# Patient Record
Sex: Male | Born: 1995 | Race: Black or African American | Hispanic: No | Marital: Single | Smoking: Never smoker
Health system: Southern US, Community
[De-identification: ages and names within clinical notes are randomized; demographics above are authoritative.]

---

## 2016-03-23 ENCOUNTER — Emergency Department (HOSPITAL_COMMUNITY)
Admission: EM | Admit: 2016-03-23 | Discharge: 2016-03-23 | Disposition: A | Discharge status: Home / Self Care | Payer: Worker's Compensation | Attending: Emergency Medicine | Admitting: Emergency Medicine

## 2016-03-23 ENCOUNTER — Encounter (HOSPITAL_COMMUNITY): Payer: Self-pay | Admitting: Emergency Medicine

## 2016-03-23 DIAGNOSIS — Y999 Unspecified external cause status: Secondary | ICD-10-CM | POA: Insufficient documentation

## 2016-03-23 DIAGNOSIS — Y939 Activity, unspecified: Secondary | ICD-10-CM | POA: Diagnosis not present

## 2016-03-23 DIAGNOSIS — W228XXA Striking against or struck by other objects, initial encounter: Secondary | ICD-10-CM | POA: Diagnosis not present

## 2016-03-23 DIAGNOSIS — S0990XA Unspecified injury of head, initial encounter: Secondary | ICD-10-CM | POA: Insufficient documentation

## 2016-03-23 DIAGNOSIS — Y929 Unspecified place or not applicable: Secondary | ICD-10-CM | POA: Diagnosis not present

## 2016-03-23 MED ORDER — IBUPROFEN 800 MG PO TABS: 800.0000 mg | ORAL_TABLET | Freq: Three times a day (TID) | ORAL | 0 refills | Status: DC

## 2016-03-23 NOTE — ED Triage Notes (Signed)
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 Having continued head pain. Slight edema noted to L forehead. No LOC. Pt alert and oriented

## 2016-03-23 NOTE — ED Provider Notes (Signed)
[facility]-EMERGENCY DEPT Provider Note   CSN: [id number] Arrival date & time: 03/23/16  0844     History   Chief Complaint Chief Complaint  Patient presents with  . Head Injury    HPI James Trevino is a 20 y.o. male.  HPI   James Trevino is a 20 y.o. male, patient with no pertinent past medical history, presenting to the ED for assessment following a head injury that occurred 4 days ago. 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 Patient complains of pain to the left scalp. Denies LOC, nausea/vomiting, dizziness, wounds, additional injury, or any other complaints.      History reviewed. No pertinent past medical history.  There are no active problems to display for this patient.   History reviewed. No pertinent surgical history.     Home Medications    Prior to Admission medications   Medication Sig Start Date End Date Taking? Authorizing Provider  ibuprofen (ADVIL,MOTRIN) 800 MG tablet Take 1 tablet (800 mg total) by mouth 3 (three) times daily. 03/23/16   Anselm PancoastShawn C Lee Kalt, PA-C    Family History History reviewed. No pertinent family history.  Social History Social History  Substance Use Topics  . Smoking status: Never Smoker  . Smokeless tobacco: Never Used  . Alcohol use No     Allergies   Patient has no known allergies.   Review of Systems Review of Systems  HENT:       Scalp tenderness  Respiratory: Negative for shortness of breath.   Cardiovascular: Negative for chest pain.  Gastrointestinal: Negative for nausea and vomiting.  Musculoskeletal: Negative for back pain and neck pain.  Skin: Negative for wound.  Neurological: Negative for dizziness, light-headedness, numbness and headaches.  All other systems reviewed and are negative.    Physical Exam Updated Vital Signs BP 117/61   Pulse 65   Temp 97.7 F (36.5 C) (Oral)   Resp 16    SpO2 100%   Physical Exam  Constitutional: He appears well-developed and well-nourished. No distress.  HENT:  Head: Normocephalic.  Tenderness to the left parietal scalp without noted swelling, crepitus, or instability. No wounds or hemorrhage noted.  Eyes: Conjunctivae and EOM are normal. Pupils are equal, round, and reactive to light.  Neck: Normal range of motion. Neck supple.  Cardiovascular: Normal rate, regular rhythm and intact distal pulses.   Pulmonary/Chest: Effort normal. No respiratory distress.  Abdominal: Soft. There is no tenderness. There is no guarding.  Musculoskeletal: He exhibits no edema.  Normal motor function intact in all extremities and spine. No midline spinal tenderness.   Lymphadenopathy:    He has no cervical adenopathy.  Neurological: He is alert.  No sensory deficits. Strength 5/5 in all extremities. No gait disturbance. Coordination intact. Cranial nerves III-XII grossly intact.   Skin: Skin is warm and dry. He is not diaphoretic.  Psychiatric: He has a normal mood and affect. His behavior is normal.  Nursing note and vitals reviewed.    ED Treatments / Results  Labs (all labs ordered are listed, but only abnormal results are displayed) Labs Reviewed - No data to display  EKG  EKG Interpretation None       Radiology No results found.  Procedures Procedures (including critical care time)  Medications Ordered in ED Medications - No data to display   Initial Impression / Assessment and Plan / ED Course  I have reviewed the triage vital signs and the nursing notes.  Pertinent labs & imaging results that were available during my care of the patient were reviewed by me and considered in my medical decision making (see chart for details).  Clinical Course     Patient presents for evaluation following a head injury 4 days ago. Patient has no neuro or functional deficits. No signs of serious head injury or concussion. No indication for  imaging at this time. The patient was given instructions for home care as well as return precautions. Patient voices understanding of these instructions, accepts the plan, and is comfortable with discharge.     Final Clinical Impressions(s) / ED Diagnoses   Final diagnoses:  Injury of head, initial encounter    New Prescriptions New Prescriptions   IBUPROFEN (ADVIL,MOTRIN) 800 MG TABLET    Take 1 tablet (800 mg total) by mouth 3 (three) times daily.     Anselm PancoastShawn C Navon Kotowski, PA-C 03/23/16 [id number]    Nira ConnPedro Eduardo Cardama, MD 03/24/16 [id number]

## 2016-03-23 NOTE — Discharge Instructions (Signed)
There were no signs of serious head injury or concussion on your exam today. Take 500 mg of naproxen every 12 hours or 800 mg of ibuprofen every 8 hours for the next 3 days. Take these medications with food to avoid upset stomach. Follow up with a primary care provider for any future management of these complaints.

## 2017-10-19 ENCOUNTER — Emergency Department (HOSPITAL_COMMUNITY): Payer: Worker's Compensation

## 2017-10-19 ENCOUNTER — Emergency Department (HOSPITAL_COMMUNITY)
Admission: EM | Admit: 2017-10-19 | Discharge: 2017-10-20 | Disposition: A | Discharge status: Home / Self Care | Payer: Worker's Compensation | Attending: Emergency Medicine | Admitting: Emergency Medicine

## 2017-10-19 ENCOUNTER — Encounter (HOSPITAL_COMMUNITY): Payer: Self-pay

## 2017-10-19 DIAGNOSIS — Y990 Civilian activity done for income or pay: Secondary | ICD-10-CM | POA: Insufficient documentation

## 2017-10-19 DIAGNOSIS — Y9289 Other specified places as the place of occurrence of the external cause: Secondary | ICD-10-CM | POA: Diagnosis not present

## 2017-10-19 DIAGNOSIS — W3189XA Contact with other specified machinery, initial encounter: Secondary | ICD-10-CM | POA: Diagnosis not present

## 2017-10-19 DIAGNOSIS — S99921A Unspecified injury of right foot, initial encounter: Secondary | ICD-10-CM | POA: Diagnosis present

## 2017-10-19 DIAGNOSIS — S9031XA Contusion of right foot, initial encounter: Secondary | ICD-10-CM | POA: Diagnosis not present

## 2017-10-19 DIAGNOSIS — Y9389 Activity, other specified: Secondary | ICD-10-CM | POA: Diagnosis not present

## 2017-10-19 MED ORDER — NAPROXEN 500 MG PO TABS: 500.0000 mg | ORAL_TABLET | Freq: Two times a day (BID) | ORAL | 0 refills | Status: DC

## 2017-10-19 MED ORDER — KETOROLAC TROMETHAMINE 30 MG/ML IJ SOLN
30.0000 mg | Freq: Once | INTRAMUSCULAR | Status: AC
  Administered 2017-10-20: 30 mg via INTRAMUSCULAR
  Filled 2017-10-19: qty 1

## 2017-10-19 NOTE — ED Triage Notes (Signed)
Pt presents with pain to R toes, pt was at work, standing on a pallet when forklift rammed into pallet, striking pt's R foot, separating his shoe; pt able to bear weight but with difficulty.

## 2017-10-19 NOTE — ED Provider Notes (Signed)
[facility] Provider Note   CSN: [id number] Arrival date & time: 10/19/17  2100     History   Chief Complaint Chief Complaint  Patient presents with  . Foot Pain    HPI James Trevino is a 22 y.o. male who presents to ED for evaluation of right foot pain.  He was at work when a forklift accidentally pierced through his shoe.  It hit his toes.  He denies any wounds.  He states that he has had pain to all of his toes and the dorsum of his foot as a result of it.  Pain is worse with bearing weight.  Denies any prior fracture, dislocations or procedures in the area.  HPI  History reviewed. No pertinent past medical history.  There are no active problems to display for this patient.   History reviewed. No pertinent surgical history.      Home Medications    Prior to Admission medications   Medication Sig Start Date End Date Taking? Authorizing Provider  ibuprofen (ADVIL,MOTRIN) 800 MG tablet Take 1 tablet (800 mg total) by mouth 3 (three) times daily. 03/23/16   Joy, Shawn C, PA-C  naproxen (NAPROSYN) 500 MG tablet Take 1 tablet (500 mg total) by mouth 2 (two) times daily. 10/19/17   Dietrich PatesKhatri, Elaria Osias, PA-C    Family History History reviewed. No pertinent family history.  Social History Social History   Tobacco Use  . Smoking status: Never Smoker  . Smokeless tobacco: Never Used  Substance Use Topics  . Alcohol use: No  . Drug use: Not on file     Allergies   Patient has no known allergies.   Review of Systems Review of Systems  Constitutional: Negative for chills and fever.  Musculoskeletal: Positive for arthralgias and myalgias. Negative for gait problem, neck pain and neck stiffness.  Skin: Negative for wound.  Neurological: Negative for weakness and numbness.     Physical Exam Updated Vital Signs BP 115/79 (BP Location: Right Arm)   Pulse 63   Temp 98.1 F (36.7 C) (Oral)   Resp 18   Ht 6\' 4"  (1.93 m)   Wt  97.5 kg (215 lb)   SpO2 100%   BMI 26.17 kg/m   Physical Exam  Constitutional: He appears well-developed and well-nourished. No distress.  HENT:  Head: Normocephalic and atraumatic.  Eyes: Conjunctivae and EOM are normal. No scleral icterus.  Neck: Normal range of motion.  Pulmonary/Chest: Effort normal. No respiratory distress.  Musculoskeletal:  Tenderness to palpation of the IP joint of the right great toe. TTP of dorsum of right foot. No visible deformity, erythema or edema noted. 2+ DP pulse noted.  Reports normal sensation with light touch.  Full active and passive range of motion of ankle and digits without difficulty.  Neurological: He is alert.  Skin: No rash noted. He is not diaphoretic.  Psychiatric: He has a normal mood and affect.  Nursing note and vitals reviewed.    ED Treatments / Results  Labs (all labs ordered are listed, but only abnormal results are displayed) Labs Reviewed - No data to display  EKG None  Radiology Dg Foot Complete Right  Result Date: 10/19/2017 CLINICAL DATA:  Injured by a forklift at work EXAM: RIGHT FOOT COMPLETE - 3+ VIEW COMPARISON:  None FINDINGS: Osseous mineralization normal. Joint spaces preserved. No acute fracture, dislocation, or bone destruction. IMPRESSION: No acute osseous abnormalities. Electronically Signed   By: Ulyses SouthwardMark  Boles M.D.   On:  10/19/2017 21:52    Procedures Procedures (including critical care time)  Medications Ordered in ED Medications  ketorolac (TORADOL) 30 MG/ML injection 30 mg (has no administration in time range)     Initial Impression / Assessment and Plan / ED Course  I have reviewed the triage vital signs and the nursing notes.  Pertinent labs & imaging results that were available during my care of the patient were reviewed by me and considered in my medical decision making (see chart for details).     Patient presents to ED for evaluation of right foot pain.  He was at work when a forklift  accidentally ran through his shoe and hit his toes.  On physical exam there is no open wounds noted.  Area is neurovascularly intact.  There is tenderness to palpation of the dorsum of the foot and digits with no changes to range of motion.  No erythema, edema or warmth noted.  X-ray returned as negative.  Suspect that symptoms are due to a contusion.  Will advise Ace wrap, rice therapy and anti-inflammatories.  Advised to return to ED for any severe worsening symptoms.  Portions of this note were generated with Scientist, clinical (histocompatibility and immunogenetics). Dictation errors may occur despite best attempts at proofreading.   Final Clinical Impressions(s) / ED Diagnoses   Final diagnoses:  Contusion of right foot, initial encounter    ED Discharge Orders        Ordered    naproxen (NAPROSYN) 500 MG tablet  2 times daily     10/19/17 2348       Dietrich Pates, PA-C 10/19/17 2351    Palumbo, April, MD 10/19/17 2351

## 2017-11-05 NOTE — ED Notes (Addendum)
6/28 the pt came to front desk asking for work note for at least a week off. Pt informed there is no saved work note on file but that I could print one for the day he was here. Pt refused that and said it would not help. Pt frustrated that I could not give him a note for 1 week plus off. Pt given business card for pt experience.

## 2018-03-08 ENCOUNTER — Other Ambulatory Visit: Payer: Self-pay | Admitting: Family Medicine

## 2018-03-08 ENCOUNTER — Ambulatory Visit: Payer: Self-pay

## 2018-03-08 DIAGNOSIS — M79674 Pain in right toe(s): Secondary | ICD-10-CM

## 2018-04-01 ENCOUNTER — Encounter (HOSPITAL_COMMUNITY): Payer: Self-pay | Admitting: *Deleted

## 2018-04-01 ENCOUNTER — Ambulatory Visit (HOSPITAL_COMMUNITY)
Admission: EM | Admit: 2018-04-01 | Discharge: 2018-04-01 | Disposition: A | Discharge status: Home / Self Care | Payer: PRIVATE HEALTH INSURANCE | Source: Home / Self Care

## 2018-04-01 ENCOUNTER — Other Ambulatory Visit: Payer: Self-pay

## 2018-04-01 ENCOUNTER — Emergency Department (HOSPITAL_COMMUNITY)
Admission: EM | Admit: 2018-04-01 | Discharge: 2018-04-02 | Disposition: A | Discharge status: Home / Self Care | Payer: PRIVATE HEALTH INSURANCE | Attending: Emergency Medicine | Admitting: Emergency Medicine

## 2018-04-01 ENCOUNTER — Encounter (HOSPITAL_COMMUNITY): Payer: Self-pay | Admitting: Emergency Medicine

## 2018-04-01 DIAGNOSIS — Z79899 Other long term (current) drug therapy: Secondary | ICD-10-CM | POA: Insufficient documentation

## 2018-04-01 DIAGNOSIS — R1013 Epigastric pain: Secondary | ICD-10-CM | POA: Insufficient documentation

## 2018-04-01 LAB — URINALYSIS, ROUTINE W REFLEX MICROSCOPIC
BILIRUBIN URINE: NEGATIVE
Glucose, UA: NEGATIVE mg/dL
HGB URINE DIPSTICK: NEGATIVE
Ketones, ur: NEGATIVE mg/dL
Leukocytes, UA: NEGATIVE
NITRITE: NEGATIVE
PROTEIN: NEGATIVE mg/dL
Specific Gravity, Urine: 1.025 (ref 1.005–1.030)
pH: 8 (ref 5.0–8.0)

## 2018-04-01 LAB — CBC
HCT: 48.3 % (ref 39.0–52.0)
Hemoglobin: 15.2 g/dL (ref 13.0–17.0)
MCH: 27 pg (ref 26.0–34.0)
MCHC: 31.5 g/dL (ref 30.0–36.0)
MCV: 85.6 fL (ref 80.0–100.0)
NRBC: 0 % (ref 0.0–0.2)
PLATELETS: 166 10*3/uL (ref 150–400)
RBC: 5.64 MIL/uL (ref 4.22–5.81)
RDW: 12.6 % (ref 11.5–15.5)
WBC: 5.2 10*3/uL (ref 4.0–10.5)

## 2018-04-01 LAB — COMPREHENSIVE METABOLIC PANEL
ALT: 37 U/L (ref 0–44)
ANION GAP: 6 (ref 5–15)
AST: 35 U/L (ref 15–41)
Albumin: 4.2 g/dL (ref 3.5–5.0)
Alkaline Phosphatase: 57 U/L (ref 38–126)
BUN: 14 mg/dL (ref 6–20)
CHLORIDE: 105 mmol/L (ref 98–111)
CO2: 27 mmol/L (ref 22–32)
Calcium: 9.6 mg/dL (ref 8.9–10.3)
Creatinine, Ser: 1.16 mg/dL (ref 0.61–1.24)
GFR calc Af Amer: >60 mL/min (ref >60)
Glucose, Bld: 101 mg/dL — ABNORMAL HIGH (ref 70–99)
POTASSIUM: 3.8 mmol/L (ref 3.5–5.1)
Sodium: 138 mmol/L (ref 135–145)
Total Bilirubin: 0.7 mg/dL (ref 0.3–1.2)
Total Protein: 7.6 g/dL (ref 6.5–8.1)

## 2018-04-01 LAB — LIPASE, BLOOD: LIPASE: 36 U/L (ref 11–51)

## 2018-04-01 MED ORDER — LIDOCAINE VISCOUS HCL 2 % MT SOLN
15.0000 mL | Freq: Once | OROMUCOSAL | Status: AC
  Administered 2018-04-02: 15 mL via ORAL
  Filled 2018-04-01: qty 15

## 2018-04-01 MED ORDER — ALUM & MAG HYDROXIDE-SIMETH 200-200-20 MG/5ML PO SUSP
30.0000 mL | Freq: Once | ORAL | Status: AC
  Administered 2018-04-02: 30 mL via ORAL
  Filled 2018-04-01: qty 30

## 2018-04-01 MED ORDER — OMEPRAZOLE 20 MG PO CPDR: 20.0000 mg | DELAYED_RELEASE_CAPSULE | Freq: Every day | ORAL | 0 refills | Status: DC

## 2018-04-01 MED ORDER — ONDANSETRON 4 MG PO TBDP: 4.0000 mg | ORAL_TABLET | Freq: Three times a day (TID) | ORAL | 0 refills | Status: AC | PRN

## 2018-04-01 NOTE — ED Provider Notes (Signed)
TIME SEEN: 11:35 PM  CHIEF COMPLAINT: Epigastric abdominal pain  HPI: Patient is a 22 year old male with no significant past medical history who presents to the emergency department with 3 days of sharp epigastric abdominal pain that is worse with lying flat.  Tried Pepto-Bismol and magnesium citrate without relief.  Seen in urgent care today and prescribed omeprazole and Zofran.  States he was told that if symptoms worsen to come to the ED.  Denies fevers, vomiting but has had nausea.  No bloody stool or melena.  No previous abdominal surgery.  No sick contacts or recent travel.  ROS: See HPI Constitutional: no fever  Eyes: no drainage  ENT: no runny nose   Cardiovascular:  no chest pain  Resp: no SOB  GI: no vomiting GU: no dysuria Integumentary: no rash  Allergy: no hives  Musculoskeletal: no leg swelling  Neurological: no slurred speech ROS otherwise negative  PAST MEDICAL HISTORY/PAST SURGICAL HISTORY:  History reviewed. No pertinent past medical history.  MEDICATIONS:  Prior to Admission medications   Medication Sig Start Date End Date Taking? Authorizing Provider  ibuprofen (ADVIL,MOTRIN) 800 MG tablet Take 1 tablet (800 mg total) by mouth 3 (three) times daily. 03/23/16   Joy, Shawn C, PA-C  omeprazole (PRILOSEC) 20 MG capsule Take 1 capsule (20 mg total) by mouth daily. 04/01/18   Cathie Hoops, Amy V, PA-C  ondansetron (ZOFRAN ODT) 4 MG disintegrating tablet Take 1 tablet (4 mg total) by mouth every 8 (eight) hours as needed for nausea or vomiting. 04/01/18   Belinda Fisher, PA-C    ALLERGIES:  No Known Allergies  SOCIAL HISTORY:  Social History   Tobacco Use  . Smoking status: Never Smoker  . Smokeless tobacco: Never Used  Substance Use Topics  . Alcohol use: No    FAMILY HISTORY: Family History  Problem Relation Age of Onset  . Healthy Mother   . Healthy Father     EXAM: BP 135/89 (BP Location: Right Arm)   Pulse 77   Temp 97.9 F (36.6 C) (Oral)   Resp 16   Ht 6'  1" (1.854 m)   Wt 93 kg   SpO2 97%   BMI 27.05 kg/m  CONSTITUTIONAL: Alert and oriented and responds appropriately to questions. Well-appearing; well-nourished HEAD: Normocephalic EYES: Conjunctivae clear, pupils appear equal, EOMI ENT: normal nose; moist mucous membranes NECK: Supple, no meningismus, no nuchal rigidity, no LAD  CARD: RRR; S1 and S2 appreciated; no murmurs, no clicks, no rubs, no gallops RESP: Normal chest excursion without splinting or tachypnea; breath sounds clear and equal bilaterally; no wheezes, no rhonchi, no rales, no hypoxia or respiratory distress, speaking full sentences ABD/GI: Normal bowel sounds; non-distended; soft, no tenderness at McBurney's point, negative Murphy sign, minimally tender in the epigastric region, no rebound, no guarding, no peritoneal signs, no hepatosplenomegaly BACK:  The back appears normal and is non-tender to palpation, there is no CVA tenderness EXT: Normal ROM in all joints; non-tender to palpation; no edema; normal capillary refill; no cyanosis, no calf tenderness or swelling    SKIN: Normal color for age and race; warm; no rash NEURO: Moves all extremities equally PSYCH: The patient's mood and manner are appropriate. Grooming and personal hygiene are appropriate.  MEDICAL DECISION MAKING: Patient here with epigastric abdominal pain.  Likely gastritis, GERD.  Doubt cholecystitis, cholelithiasis, pancreatitis, appendicitis, colitis.  Labs obtained in triage are unremarkable including normal LFTs and lipase.  No leukocytosis.  Urine shows no blood or sign of infection.  We will treat symptomatically with GI cocktail and reassess.  ED PROGRESS: Patient reports pain has significantly improved.  I feel he is safe to be discharged home and use over-the-counter medications as needed for pain.  Discussed return precautions and recommended diet changes.  He is comfortable with this plan.   At this time, I do not feel there is any  life-threatening condition present. I have reviewed and discussed all results (EKG, imaging, lab, urine as appropriate) and exam findings with patient/family. I have reviewed nursing notes and appropriate previous records.  I feel the patient is safe to be discharged home without further emergent workup and can continue workup as an outpatient as needed. Discussed usual and customary return precautions. Patient/family verbalize understanding and are comfortable with this plan.  Outpatient follow-up has been provided if needed. All questions have been answered.      Legend Pecore, Layla MawKristen N, DO 04/02/18 [phone]

## 2018-04-01 NOTE — ED Triage Notes (Addendum)
C/o constant mid upper abd pain x 2-3 days that is worse with deep breathing and lying down.  Took Pepto Bismol yesterday without relief and took laxative after talking to pharmacist this morning.  Reports diarrhea since taking laxative around 6:30am and nausea since 1pm.  [facility].  States he took 1 Omeprazole without relief and hasn't taken any Zofran yet.

## 2018-04-01 NOTE — Discharge Instructions (Signed)
No alarming signs on exam.  Start omeprazole as directed.  I have also called in some Zofran, this is to treat for nausea/vomiting.  You do not have to pick this up unless you need it. Keep hydrated, you urine should be clear to pale yellow in color. Bland diet, advance as tolerated.  As discussed, if symptoms not improving, but no worsening, follow-up with PCP for further evaluation needed.  If experiencing worsening symptoms, nausea/vomiting not controlled by medication, fever, go to emergency department for further evaluation.

## 2018-04-01 NOTE — ED Provider Notes (Signed)
[facility]    CSN: [id number] Arrival date & time: 04/01/18  1437     History   Chief Complaint Chief Complaint  Patient presents with  . Abdominal Pain    HPI James Trevino is a 22 y.o. male.   22 year old male comes in for 3-day history of epigastric pain.  States symptoms started shortly after eating at a friend's house.  Pain is intermittent, worse with deep breaths, eating.  Denies nausea, vomiting.  Took Pepto-Bismol yesterday, and did not find relief.  Went to the pharmacy today, who suggested laxatives for possible constipation.  Took mag citrate and has had multiple bowel movements since without obvious relief of pain.  Denies chronic NSAID use, excessive alcohol use.  Has not started any new medications.  Denies history of abdominal surgeries.     History reviewed. No pertinent past medical history.  There are no active problems to display for this patient.   History reviewed. No pertinent surgical history.     Home Medications    Prior to Admission medications   Medication Sig Start Date End Date Taking? Authorizing Provider  ibuprofen (ADVIL,MOTRIN) 800 MG tablet Take 1 tablet (800 mg total) by mouth 3 (three) times daily. 03/23/16   Joy, Shawn C, PA-C  omeprazole (PRILOSEC) 20 MG capsule Take 1 capsule (20 mg total) by mouth daily. 04/01/18   Cathie Hoops, Ersel Wadleigh V, PA-C  ondansetron (ZOFRAN ODT) 4 MG disintegrating tablet Take 1 tablet (4 mg total) by mouth every 8 (eight) hours as needed for nausea or vomiting. 04/01/18   Belinda Fisher, PA-C    Family History Family History  Problem Relation Age of Onset  . Healthy Mother   . Healthy Father     Social History Social History   Tobacco Use  . Smoking status: Never Smoker  . Smokeless tobacco: Never Used  Substance Use Topics  . Alcohol use: No  . Drug use: Not on file     Allergies   Patient has no known allergies.   Review of Systems Review of Systems  Reason unable to perform ROS: See  HPI as above.     Physical Exam Triage Vital Signs ED Triage Vitals  Enc Vitals Group     BP 04/01/18 1514 (!) 106/50     Pulse Rate 04/01/18 1514 73     Resp 04/01/18 1514 15     Temp 04/01/18 1514 98.1 F (36.7 C)     Temp Source 04/01/18 1514 Oral     SpO2 04/01/18 1514 98 %     Weight --      Height --      Head Circumference --      Peak Flow --      Pain Score 04/01/18 1512 10     Pain Loc --      Pain Edu? --      Excl. in GC? --    No data found.  Updated Vital Signs BP (!) 106/50 (BP Location: Left Arm)   Pulse 73   Temp 98.1 F (36.7 C) (Oral)   Resp 15   SpO2 98%   Visual Acuity Right Eye Distance:   Left Eye Distance:   Bilateral Distance:    Right Eye Near:   Left Eye Near:    Bilateral Near:     Physical Exam  Constitutional: He is oriented to person, place, and time. He appears well-developed and well-nourished.  Non-toxic appearance. He does not appear ill. No distress.  HENT:  Head: Normocephalic and atraumatic.  Cardiovascular: Normal rate, regular rhythm and normal heart sounds. Exam reveals no gallop and no friction rub.  No murmur heard. Pulmonary/Chest: Effort normal and breath sounds normal. He has no wheezes. He has no rales.  Abdominal: Soft. Bowel sounds are increased.  Mild tenderness to palpation of epigastric region without obvious guarding or rebound.  Negative CVA tenderness, rigidity.  Neurological: He is alert and oriented to person, place, and time.  Skin: Skin is warm and dry.  Psychiatric: He has a normal mood and affect. His behavior is normal. Judgment normal.     UC Treatments / Results  Labs (all labs ordered are listed, but only abnormal results are displayed) Labs Reviewed - No data to display  EKG None  Radiology No results found.  Procedures Procedures (including critical care time)  Medications Ordered in UC Medications - No data to display  Initial Impression / Assessment and Plan / UC Course  I  have reviewed the triage vital signs and the nursing notes.  Pertinent labs & imaging results that were available during my care of the patient were reviewed by me and considered in my medical decision making (see chart for details).    No alarming signs on exam. Will start omeprazole for now.  Zofran as needed for nausea/vomiting.  Will have patient start bland diet, advance as tolerated.  Push fluids.  Return precautions given.  Patient expresses understanding and agrees to plan.  Final Clinical Impressions(s) / UC Diagnoses   Final diagnoses:  Epigastric pain   ED Prescriptions    Medication Sig Dispense Auth. Provider   omeprazole (PRILOSEC) 20 MG capsule Take 1 capsule (20 mg total) by mouth daily. 15 capsule Roshell Brigham V, PA-C   ondansetron (ZOFRAN ODT) 4 MG disintegrating tablet Take 1 tablet (4 mg total) by mouth every 8 (eight) hours as needed for nausea or vomiting. 10 tablet Threasa AlphaYu, Taylon Louison V, PA-C        Lattie Cervi V, [place] JerseyPA-C 04/01/18 1824

## 2018-04-01 NOTE — ED Triage Notes (Signed)
Reports 3 day history of sharp abdominal pain, states took pepto yesterday, today took laxative. Patient reports multiple bowel movements after taking laxative. States however he is still having the sharp pain-points to mid abdomen, states can feel the pain when he breaths.

## 2018-04-02 NOTE — Discharge Summary (Signed)
Discharge instructions reviewed with patient. All questions answered. Patient ambulated to vehicle with belongings 

## 2018-04-02 NOTE — Discharge Instructions (Signed)
°  Your labs and urine today were normal.  I recommend that you use Maalox or Mylanta over-the-counter as needed for upper abdominal pain.  You may also use Tums and over-the-counter Pepcid.  Please continue your omeprazole and Zofran.  You should take omeprazole every day.     To find a primary care or specialty doctor please call [phone] or [phone] to access "[place] Find a Doctor Service."  You may also go on the [facility] website at [email or web address]  There are also multiple [facility], [facility], [facility] and [facility] practices throughout the [place] that are frequently accepting new patients. You may find a clinic that is close to your home and contact them.  [facility] -  [address] [place] [id number] [phone]   [facility] -  [address] [place] [id number] [phone]   [facility] [phone]  [place] [phone]

## 2018-04-03 ENCOUNTER — Emergency Department (HOSPITAL_COMMUNITY): Payer: PRIVATE HEALTH INSURANCE

## 2018-04-03 ENCOUNTER — Emergency Department (HOSPITAL_COMMUNITY)
Admission: EM | Admit: 2018-04-03 | Discharge: 2018-04-03 | Disposition: A | Discharge status: Home / Self Care | Payer: PRIVATE HEALTH INSURANCE | Attending: Emergency Medicine | Admitting: Emergency Medicine

## 2018-04-03 ENCOUNTER — Encounter (HOSPITAL_COMMUNITY): Payer: Self-pay | Admitting: *Deleted

## 2018-04-03 ENCOUNTER — Other Ambulatory Visit: Payer: Self-pay

## 2018-04-03 DIAGNOSIS — R1013 Epigastric pain: Secondary | ICD-10-CM | POA: Diagnosis present

## 2018-04-03 MED ORDER — FAMOTIDINE 20 MG PO TABS
20.0000 mg | ORAL_TABLET | Freq: Once | ORAL | Status: AC
  Administered 2018-04-03: 20 mg via ORAL
  Filled 2018-04-03: qty 1

## 2018-04-03 MED ORDER — LIDOCAINE VISCOUS HCL 2 % MT SOLN
15.0000 mL | Freq: Once | OROMUCOSAL | Status: AC
  Administered 2018-04-03: 15 mL via ORAL
  Filled 2018-04-03: qty 15

## 2018-04-03 MED ORDER — ALUM & MAG HYDROXIDE-SIMETH 200-200-20 MG/5ML PO SUSP
30.0000 mL | Freq: Once | ORAL | Status: AC
  Administered 2018-04-03: 30 mL via ORAL
  Filled 2018-04-03: qty 30

## 2018-04-03 NOTE — ED Triage Notes (Signed)
Pt reports epigastric pain since wed. Was seen here and [facility] on Friday, given zofran, omeprazole and ibuprofen with no relief. Denies any n/v. Pain increases when sitting, lying down and leaning forward.

## 2018-04-03 NOTE — Discharge Instructions (Addendum)
Your ultrasound is normal. Stop taking ibuprofen. Increase omeprazole (prilosec) to twice per day. If your symptoms do not begin to improve within a few days then I would follow-up with gastroenterology.

## 2018-04-03 NOTE — ED Notes (Signed)
Patient transported to Ultrasound 

## 2018-04-14 NOTE — ED Provider Notes (Signed)
[facility] EMERGENCY DEPARTMENT Provider Note   CSN: [id number] Arrival date & time: 04/03/18  1542     History   Chief Complaint Chief Complaint  Patient presents with  . Abdominal Pain    HPI James Trevino is a 22 y.o. male.  HPI   23 year old male with abdominal pain.  Epigastric.  Onset Wednesday.  He was seen in the emergency room and in urgent care on Friday.  Given Zofran, omeprazole advised to take ibuprofen.  He is taking this without improvement.  Pain increases with certain positions such as lying on his back and also sometimes when the process of sitting up.  Sometimes radiates into his back.  No clear change after eating.  He has not had much of an appetite.  Mild nausea.  No vomiting.  Complaints.  Does take ibuprofen sporadically for aches/pains.  No blood in stool or melena.  History reviewed. No pertinent past medical history.  There are no active problems to display for this patient.   History reviewed. No pertinent surgical history.      Home Medications    Prior to Admission medications   Medication Sig Start Date End Date Taking? Authorizing Provider  omeprazole (PRILOSEC) 20 MG capsule Take 1 capsule (20 mg total) by mouth daily. 04/01/18   Cathie Hoops, Amy V, PA-C  ondansetron (ZOFRAN ODT) 4 MG disintegrating tablet Take 1 tablet (4 mg total) by mouth every 8 (eight) hours as needed for nausea or vomiting. 04/01/18   Belinda Fisher, PA-C    Family History Family History  Problem Relation Age of Onset  . Healthy Mother   . Healthy Father     Social History Social History   Tobacco Use  . Smoking status: Never Smoker  . Smokeless tobacco: Never Used  Substance Use Topics  . Alcohol use: No  . Drug use: Never     Allergies   Patient has no known allergies.   Review of Systems Review of Systems  All systems reviewed and negative, other than as noted in HPI.  Physical Exam Updated Vital Signs BP 132/81 (BP Location:  Right Arm)   Pulse 88   Temp 98.4 F (36.9 C) (Oral)   Resp 16   SpO2 100%   Physical Exam  Constitutional: He appears well-developed and well-nourished. No distress.  HENT:  Head: Normocephalic and atraumatic.  Eyes: Conjunctivae are normal. Right eye exhibits no discharge. Left eye exhibits no discharge.  Neck: Neck supple.  Cardiovascular: Normal rate, regular rhythm and normal heart sounds. Exam reveals no gallop and no friction rub.  No murmur heard. Pulmonary/Chest: Effort normal and breath sounds normal. No respiratory distress.  Abdominal: Soft. He exhibits no distension. There is tenderness.  Epigastric tenderness no rebound or guarding.  No distention.  Musculoskeletal: He exhibits no edema or tenderness.  Neurological: He is alert.  Skin: Skin is warm and dry.  Psychiatric: He has a normal mood and affect. His behavior is normal. Thought content normal.  Nursing note and vitals reviewed.    ED Treatments / Results  Labs (all labs ordered are listed, but only abnormal results are displayed) Labs Reviewed - No data to display  EKG None  Radiology No results found.   US Abdomen Limited  Result Date: 04/03/2018 CLINICAL DATA:  Right upper quadrant and epigastric pain since Wednesday. EXAM: ULTRASOUND ABDOMEN LIMITED RIGHT UPPER QUADRANT COMPARISON:  None. FINDINGS: Gallbladder: No gallstones or wall thickening visualized. No sonographic Murphy sign noted by  sonographer. Common bile duct: Diameter: 2 mm Liver: No focal lesion identified. Within normal limits in parenchymal echogenicity. Portal vein is patent on color Doppler imaging with normal direction of blood flow towards the liver. IMPRESSION: No cause for right upper quadrant pain identified. The gallbladder, common bile duct, and liver are unremarkable. Electronically Signed   By: Gerome Samavid  Williams III M.D   On: 04/03/2018 18:10    Procedures Procedures (including critical care time)  Medications Ordered in  ED Medications  famotidine (PEPCID) tablet 20 mg (20 mg Oral Given 04/03/18 1708)  alum & mag hydroxide-simeth (MAALOX/MYLANTA) 200-200-20 MG/5ML suspension 30 mL (30 mLs Oral Given 04/03/18 1708)    And  lidocaine (XYLOCAINE) 2 % viscous mouth solution 15 mL (15 mLs Oral Given 04/03/18 1709)     Initial Impression / Assessment and Plan / ED Course  I have reviewed the triage vital signs and the nursing notes.  Pertinent labs & imaging results that were available during my care of the patient were reviewed by me and considered in my medical decision making (see chart for details).    22 year old male with persistent epigastric pain.  Recently seen for the same.  Ultrasound today without acute abnormality.  Suspect this may be gastritis or PUD.  Advised to stop NSAIDs.  Increase PPI to twice daily.  Diet discussed.  Return precautions discussed as well.  Outpatient follow-up for persistent symptoms.  Final Clinical Impressions(s) / ED Diagnoses   Final diagnoses:  Epigastric pain    ED Discharge Orders    None       Raeford RazorKohut, Pascual Mantel, MD 04/14/18 1242

## 2018-04-21 ENCOUNTER — Other Ambulatory Visit: Payer: Self-pay

## 2018-04-21 ENCOUNTER — Emergency Department (HOSPITAL_COMMUNITY)
Admission: EM | Admit: 2018-04-21 | Discharge: 2018-04-21 | Disposition: A | Discharge status: Home / Self Care | Payer: PRIVATE HEALTH INSURANCE | Attending: Emergency Medicine | Admitting: Emergency Medicine

## 2018-04-21 ENCOUNTER — Encounter (HOSPITAL_COMMUNITY): Payer: Self-pay

## 2018-04-21 DIAGNOSIS — R1013 Epigastric pain: Secondary | ICD-10-CM | POA: Diagnosis not present

## 2018-04-21 LAB — CBC WITH DIFFERENTIAL/PLATELET
Abs Immature Granulocytes: 0 10*3/uL (ref 0.00–0.07)
BASOS ABS: 0 10*3/uL (ref 0.0–0.1)
BASOS PCT: 0 %
EOS PCT: 2 %
Eosinophils Absolute: 0.1 10*3/uL (ref 0.0–0.5)
HEMATOCRIT: 45.1 % (ref 39.0–52.0)
HEMOGLOBIN: 14.3 g/dL (ref 13.0–17.0)
Immature Granulocytes: 0 %
Lymphocytes Relative: 43 %
Lymphs Abs: 2.3 10*3/uL (ref 0.7–4.0)
MCH: 27.4 pg (ref 26.0–34.0)
MCHC: 31.7 g/dL (ref 30.0–36.0)
MCV: 86.6 fL (ref 80.0–100.0)
MONOS PCT: 8 %
Monocytes Absolute: 0.4 10*3/uL (ref 0.1–1.0)
Neutro Abs: 2.4 10*3/uL (ref 1.7–7.7)
Neutrophils Relative %: 47 %
PLATELETS: 128 10*3/uL — AB (ref 150–400)
RBC: 5.21 MIL/uL (ref 4.22–5.81)
RDW: 13 % (ref 11.5–15.5)
WBC: 5.2 10*3/uL (ref 4.0–10.5)
nRBC: 0 % (ref 0.0–0.2)

## 2018-04-21 LAB — COMPREHENSIVE METABOLIC PANEL
ALBUMIN: 4.1 g/dL (ref 3.5–5.0)
ALK PHOS: 48 U/L (ref 38–126)
ALT: 31 U/L (ref 0–44)
AST: 29 U/L (ref 15–41)
Anion gap: 11 (ref 5–15)
BILIRUBIN TOTAL: 1.2 mg/dL (ref 0.3–1.2)
BUN: 13 mg/dL (ref 6–20)
CALCIUM: 9.6 mg/dL (ref 8.9–10.3)
CHLORIDE: 102 mmol/L (ref 98–111)
CO2: 26 mmol/L (ref 22–32)
CREATININE: 1.18 mg/dL (ref 0.61–1.24)
GFR calc non Af Amer: >60 mL/min (ref >60)
GLUCOSE: 92 mg/dL (ref 70–99)
Potassium: 3.8 mmol/L (ref 3.5–5.1)
Sodium: 139 mmol/L (ref 135–145)
Total Protein: 7.1 g/dL (ref 6.5–8.1)

## 2018-04-21 LAB — LIPASE, BLOOD: Lipase: 26 U/L (ref 11–51)

## 2018-04-21 MED ORDER — OMEPRAZOLE 20 MG PO CPDR: 20.0000 mg | DELAYED_RELEASE_CAPSULE | Freq: Two times a day (BID) | ORAL | 0 refills | Status: AC

## 2018-04-21 MED ORDER — ALUM & MAG HYDROXIDE-SIMETH 200-200-20 MG/5ML PO SUSP
30.0000 mL | Freq: Once | ORAL | Status: AC
  Administered 2018-04-21: 30 mL via ORAL
  Filled 2018-04-21: qty 30

## 2018-04-21 NOTE — ED Provider Notes (Signed)
[facility]   CSN: [id number] Arrival date & time: 04/21/18  0745     History   Chief Complaint Chief Complaint  Patient presents with  . Abdominal Pain    HPI James Trevino is a 22 y.o. male.  22 year old male returns to the ER with ongoing epigastric abdominal pain.  Patient states that his pain started in mid November, tried OTC remedies including ginger ale and crackers.  Patient states that he went to the pharmacy and describes the problem he was having and was advised to try milk of magnesia which he states resulted in several bowel movements but did not help with his epigastric pain.  Patient went to urgent care and was given antacid and Zofran, states his pain got worse so he went to the emergency room where he was evaluated on November 22 on November 24, had lab work and an ultrasound, told he likely had a stomach ulcer.  Patient states that he was told previously to take ibuprofen for his pain but he found that this made his pain worse and when he was told he may have a stomach ulcer was told NSAIDs would make gastritis or stomach ulcer worsen he should avoid these altogether.  Patient is scheduled to see GI next week, comes to the ER today for ongoing pain, not worse however states he has run out of the antacid he was given.  Pain is worse with lying supine and sitting fully upright, improves if he is in a semireclined position.  Pain improves with eating briefly.  He denies nausea, vomiting, fevers, chills, chest pain or shortness of breath, dark or bloody stools, changes in bowel or bladder habits.  Patient denies history of previous stomach problems.  Patient injured his foot at work in late October and took a course of NSAIDs for that injury.     History reviewed. No pertinent past medical history.  There are no active problems to display for this patient.   History reviewed. No pertinent surgical  history.      Home Medications    Prior to Admission medications   Medication Sig Start Date End Date Taking? Authorizing Provider  omeprazole (PRILOSEC) 20 MG capsule Take 1 capsule (20 mg total) by mouth 2 (two) times daily before a meal. 04/21/18 05/21/18 Yes Army MeliaMurphy, Jah Alarid A, PA-C  ondansetron (ZOFRAN ODT) 4 MG disintegrating tablet Take 1 tablet (4 mg total) by mouth every 8 (eight) hours as needed for nausea or vomiting. Patient not taking: Reported on 04/21/2018 04/01/18   Lurline IdolYu, Amy V, PA-C    Family History Family History  Problem Relation Age of Onset  . Healthy Mother   . Healthy Father     Social History Social History   Tobacco Use  . Smoking status: Never Smoker  . Smokeless tobacco: Never Used  Substance Use Topics  . Alcohol use: No  . Drug use: Never     Allergies   Patient has no known allergies.   Review of Systems Review of Systems  Constitutional: Negative for fever.  Respiratory: Negative for shortness of breath.   Cardiovascular: Negative for chest pain.  Gastrointestinal: Positive for abdominal pain and diarrhea. Negative for abdominal distention, constipation, nausea and vomiting.  Genitourinary: Negative for decreased urine volume and difficulty urinating.  Musculoskeletal: Negative for arthralgias, back pain and myalgias.  Skin: Negative for rash and wound.  Allergic/Immunologic: Negative for immunocompromised state.  Neurological: Negative for weakness.  Hematological: Does not  bruise/bleed easily.  Psychiatric/Behavioral: Negative for confusion.  All other systems reviewed and are negative.    Physical Exam Updated Vital Signs BP 124/84   Pulse 69   Temp 98.5 F (36.9 C) (Oral)   Resp 16   Ht 6\' 4"  (1.93 m)   Wt 95.3 kg   SpO2 100%   BMI 25.56 kg/m   Physical Exam Vitals signs and nursing note reviewed.  Constitutional:      General: He is not in acute distress.    Appearance: He is well-developed. He is not diaphoretic.   HENT:     Head: Normocephalic and atraumatic.  Cardiovascular:     Rate and Rhythm: Normal rate and regular rhythm.     Heart sounds: Normal heart sounds. No murmur.  Pulmonary:     Effort: Pulmonary effort is normal.     Breath sounds: Normal breath sounds.  Abdominal:     General: Abdomen is flat.     Palpations: Abdomen is soft.     Tenderness: There is abdominal tenderness in the epigastric area. There is no right CVA tenderness or left CVA tenderness.  Skin:    General: Skin is warm and dry.     Findings: No rash.  Neurological:     Mental Status: He is alert and oriented to person, place, and time.  Psychiatric:        Behavior: Behavior normal.      ED Treatments / Results  Labs (all labs ordered are listed, but only abnormal results are displayed) Labs Reviewed  CBC WITH DIFFERENTIAL/PLATELET - Abnormal; Notable for the following components:      Result Value   Platelets 128 (*)    All other components within normal limits  COMPREHENSIVE METABOLIC PANEL  LIPASE, BLOOD    EKG None  Radiology No results found.  Procedures Procedures (including critical care time)  Medications Ordered in ED Medications  alum & mag hydroxide-simeth (MAALOX/MYLANTA) 200-200-20 MG/5ML suspension 30 mL (30 mLs Oral Given 04/21/18 1610)     Initial Impression / Assessment and Plan / ED Course  I have reviewed the triage vital signs and the nursing notes.  Pertinent labs & imaging results that were available during my care of the patient were reviewed by me and considered in my medical decision making (see chart for details).  Clinical Course as of Apr 21 1046  Thu Apr 21, 2018  154 22 year old male returns to the ER with ongoing epigastric abdominal discomfort and diarrhea.  Patient has run out of his medication previously prescribed in the ER for same, denies worsening pain, denies bloody emesis or bloody stools.  Patient is scheduled to follow-up with GI next week.  Lab  work is unremarkable including a lipase, CMP, CBC.  Patient given prescription for omeprazole to take twice daily.  Also given instructions for dietary changes for possible peptic ulcer disease.  Patient given return precautions otherwise follow-up with GI as scheduled.   [LM]    Clinical Course User Index [LM] Jeannie Fend, PA-C   Final Clinical Impressions(s) / ED Diagnoses   Final diagnoses:  Epigastric pain    ED Discharge Orders         Ordered    omeprazole (PRILOSEC) 20 MG capsule  2 times daily before meals     04/21/18 0906           Jeannie Fend, PA-C 04/21/18 1047    Arby Barrette, MD 04/21/18 1410

## 2018-04-21 NOTE — Discharge Instructions (Signed)
Continue with Prilosec twice daily as prescribed. Follow-up with GI as scheduled. Return to the ER for worsening or concerning symptoms.

## 2018-04-21 NOTE — ED Triage Notes (Signed)
Pt endorses epigastric pain intermittently x 1 month. Seen here 2 times previously for same and had been taking ibuprofen which makes pain worse. Pain is worse with different positions. VSS.

## 2019-12-28 IMAGING — US US ABDOMEN LIMITED
1 series · 14 of 25 positions shown · non-contrast
Comparison: None.

CLINICAL DATA: Right upper quadrant and epigastric pain since
[REDACTED].

EXAM:
ULTRASOUND ABDOMEN LIMITED RIGHT UPPER QUADRANT

[Series 1: us abdomen limited · 0.17mm/px · 14 of 28 slices shown]
[im 1/28]
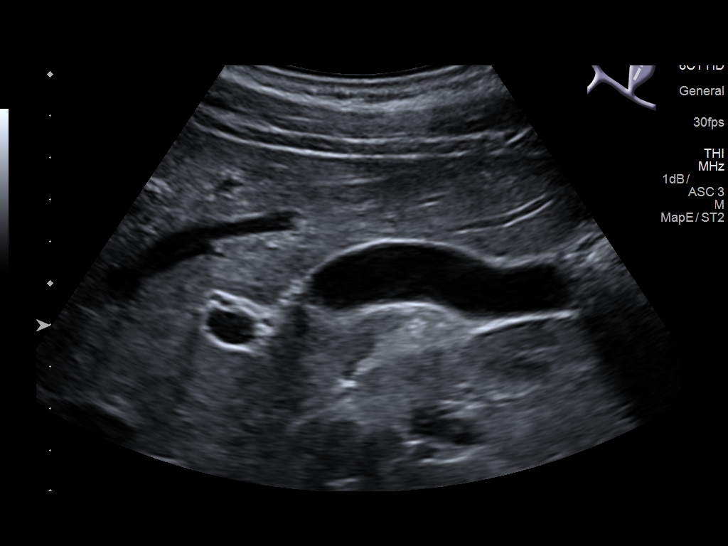
[im 3/28]
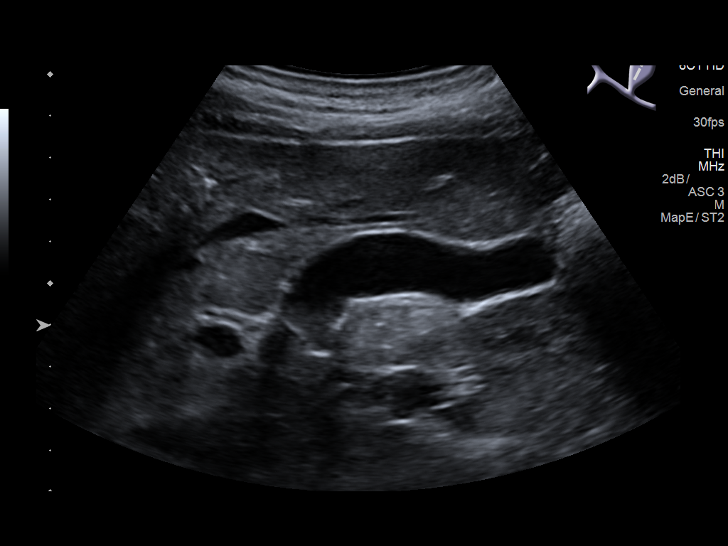
[im 5/28]
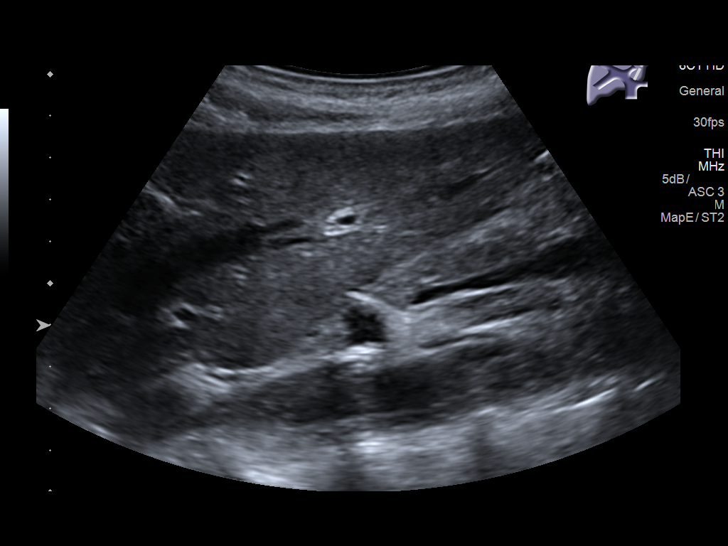
[im 7/28]
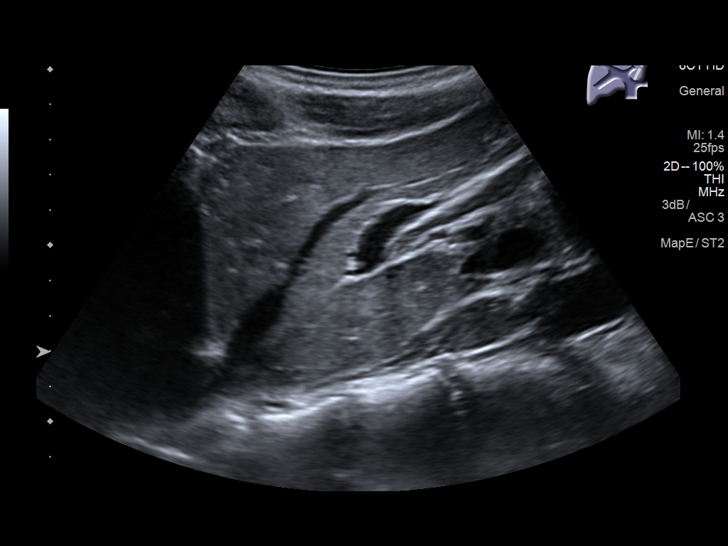
[im 10/28]
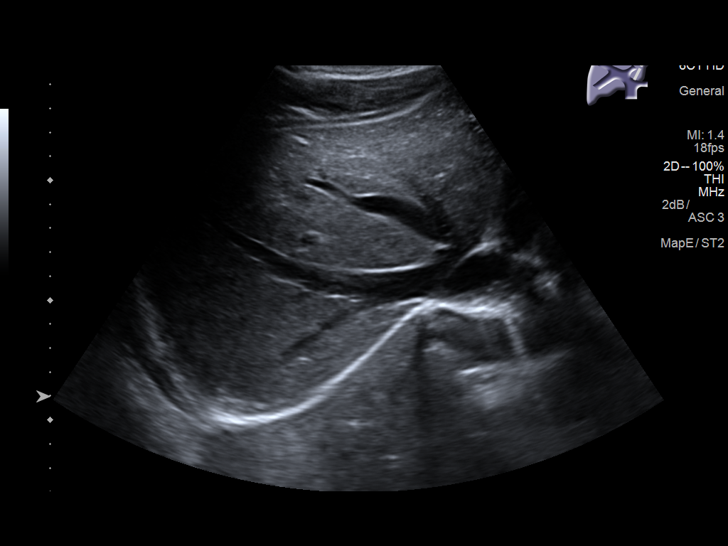
[im 11/28]
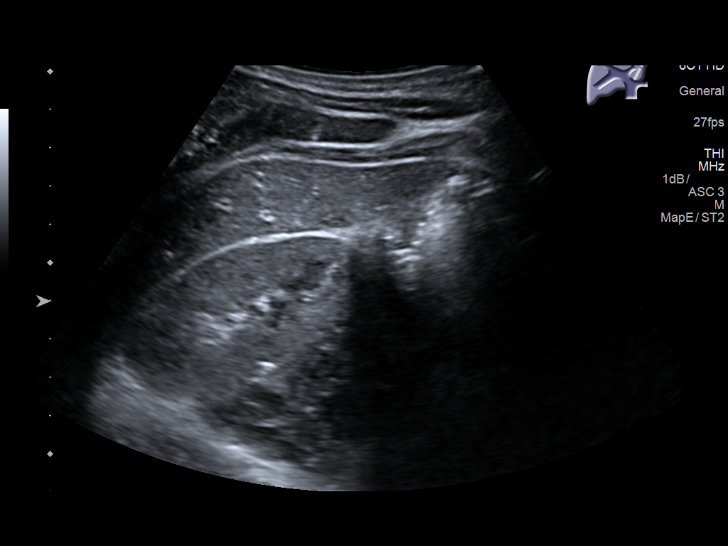
[im 13/28]
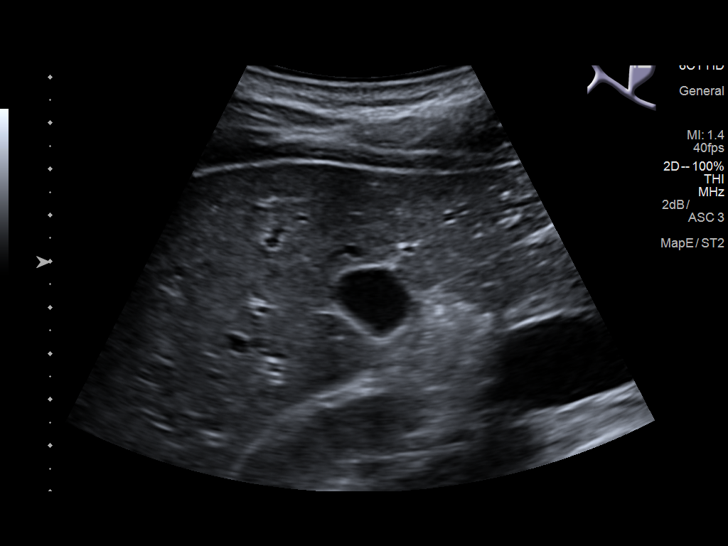
[im 15/28]
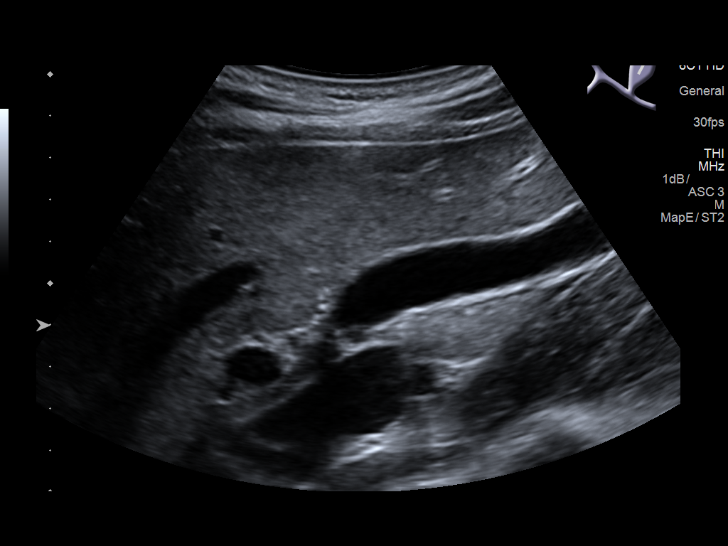
[im 17/28]
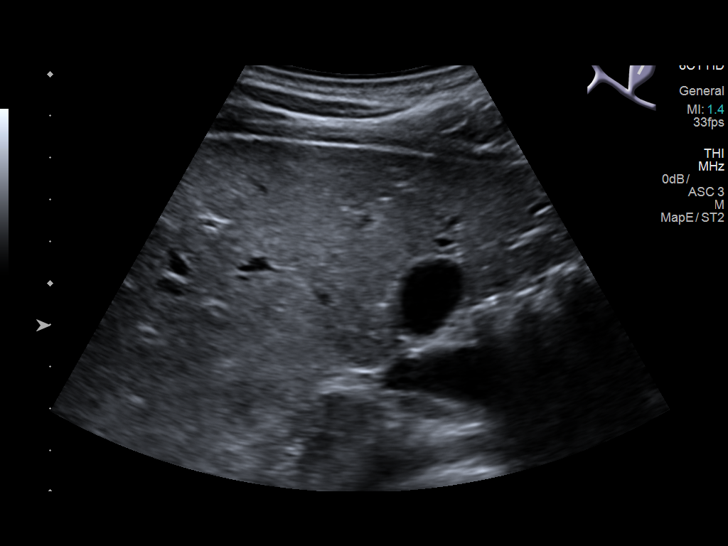
[im 19/28]
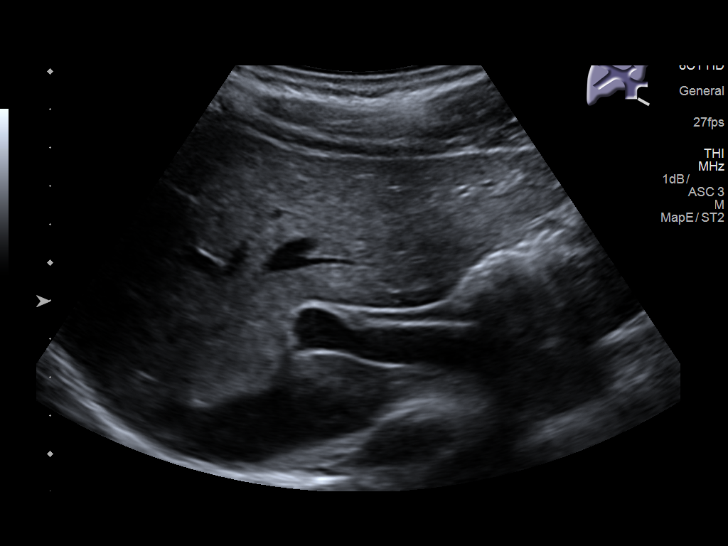
[im 21/28]
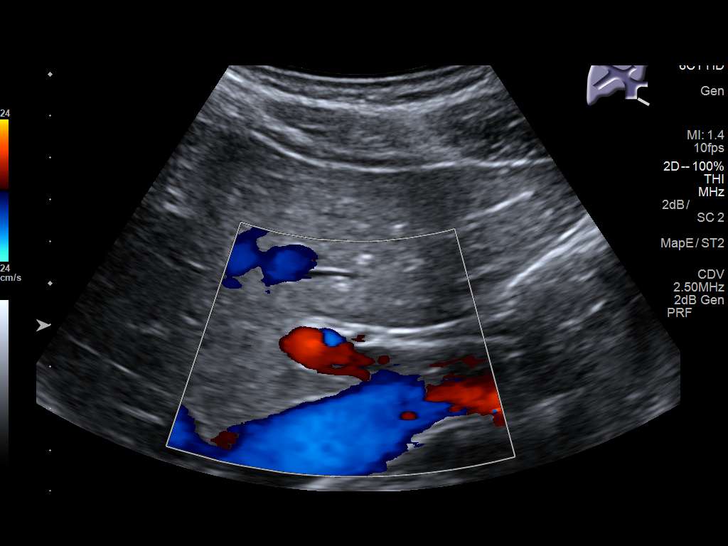
[im 23/28]
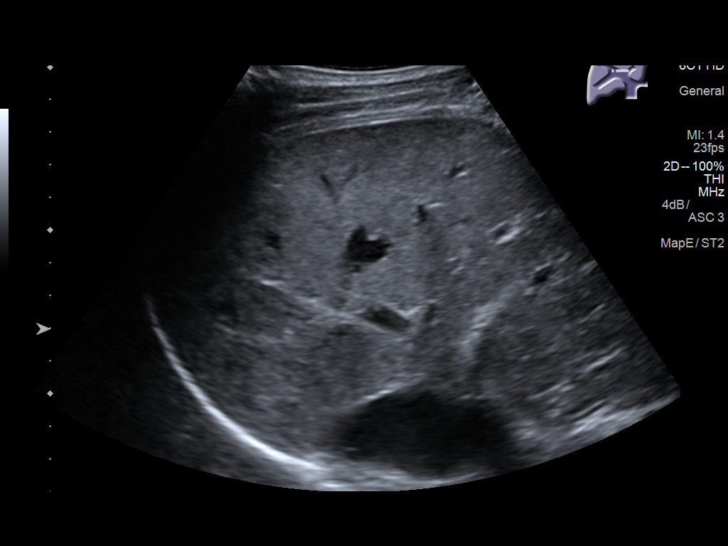
[im 25/28]
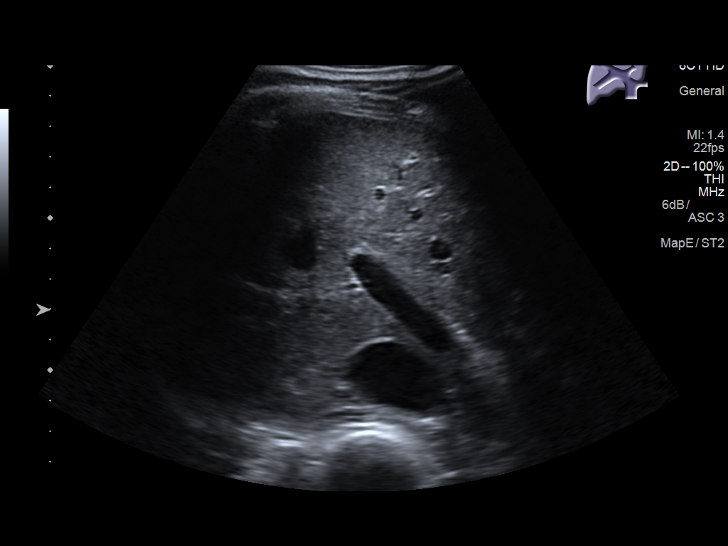
[im 28/28]
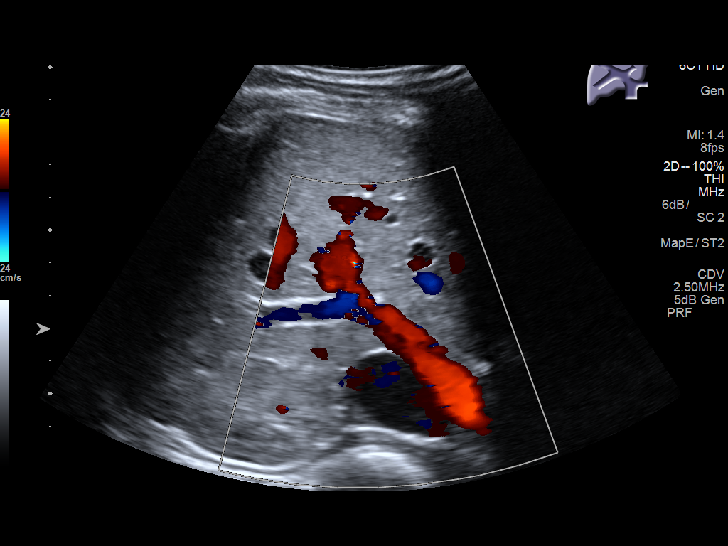

[14 of 25 positions shown; findings below may reference images not displayed]

FINDINGS: Gallbladder:

No gallstones or wall thickening visualized. No sonographic Murphy
sign noted by sonographer.

Common bile duct:

Diameter: 2 mm

Liver:

No focal lesion identified. Within normal limits in parenchymal
echogenicity. Portal vein is patent on color Doppler imaging with
normal direction of blood flow towards the liver.
IMPRESSION: No cause for right upper quadrant pain identified. The gallbladder,
common bile duct, and liver are unremarkable.
